# Patient Record
Sex: Female | Born: 2005 | Hispanic: Yes | Marital: Single | Smoking: Never smoker
Health system: Southern US, Community
[De-identification: ages and names within clinical notes are randomized; demographics above are authoritative.]

---
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
## 2023-03-10 IMAGING — CR DG ANKLE COMPLETE 3+V*R*
3 series · 3 of 3 positions shown · non-contrast
Comparison: 08/14/2016

CLINICAL DATA: Ankle trauma

EXAM:
RIGHT ANKLE - COMPLETE 3+ VIEW

[ankle ap]
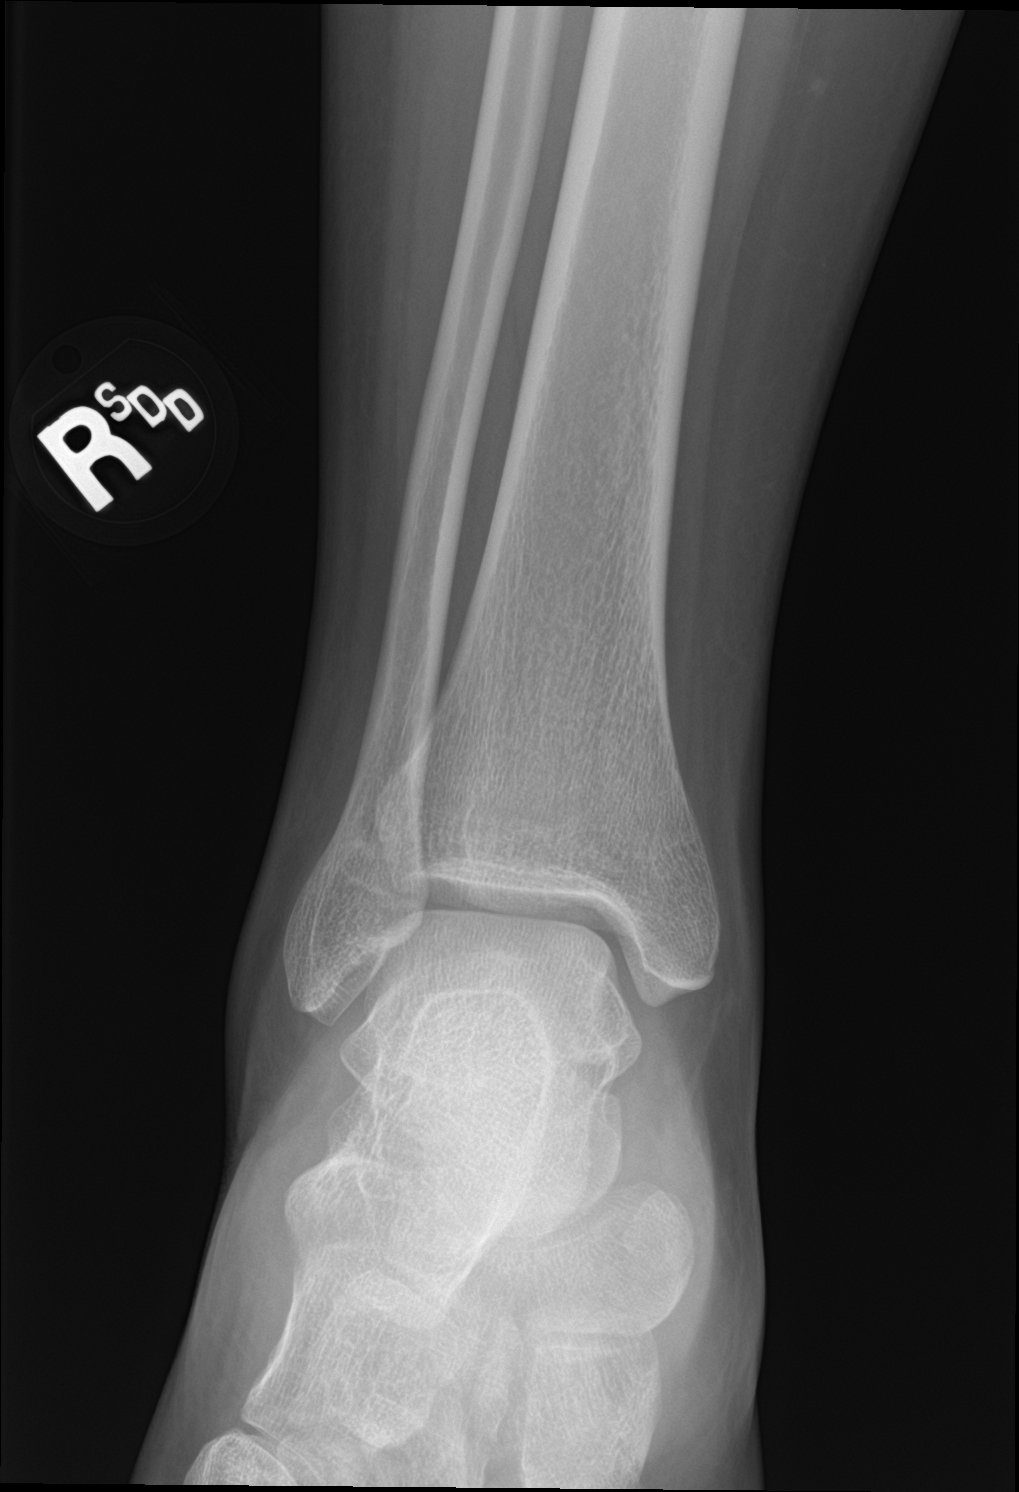

[ankle obl]
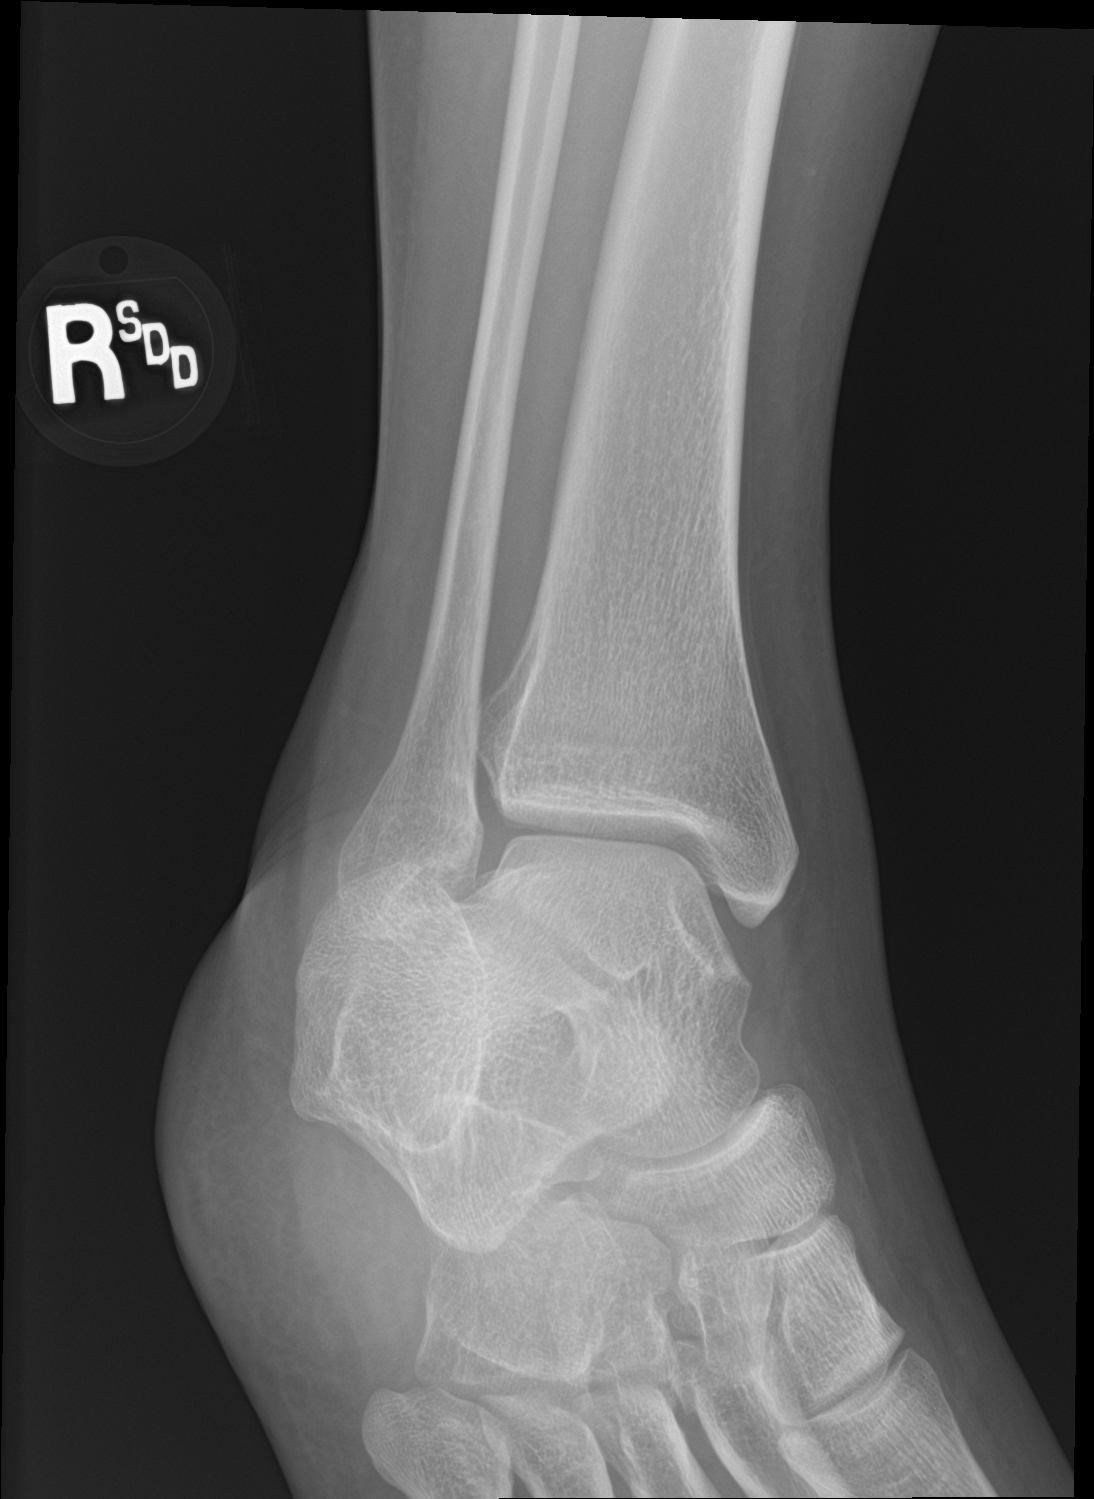

[ankle lat]
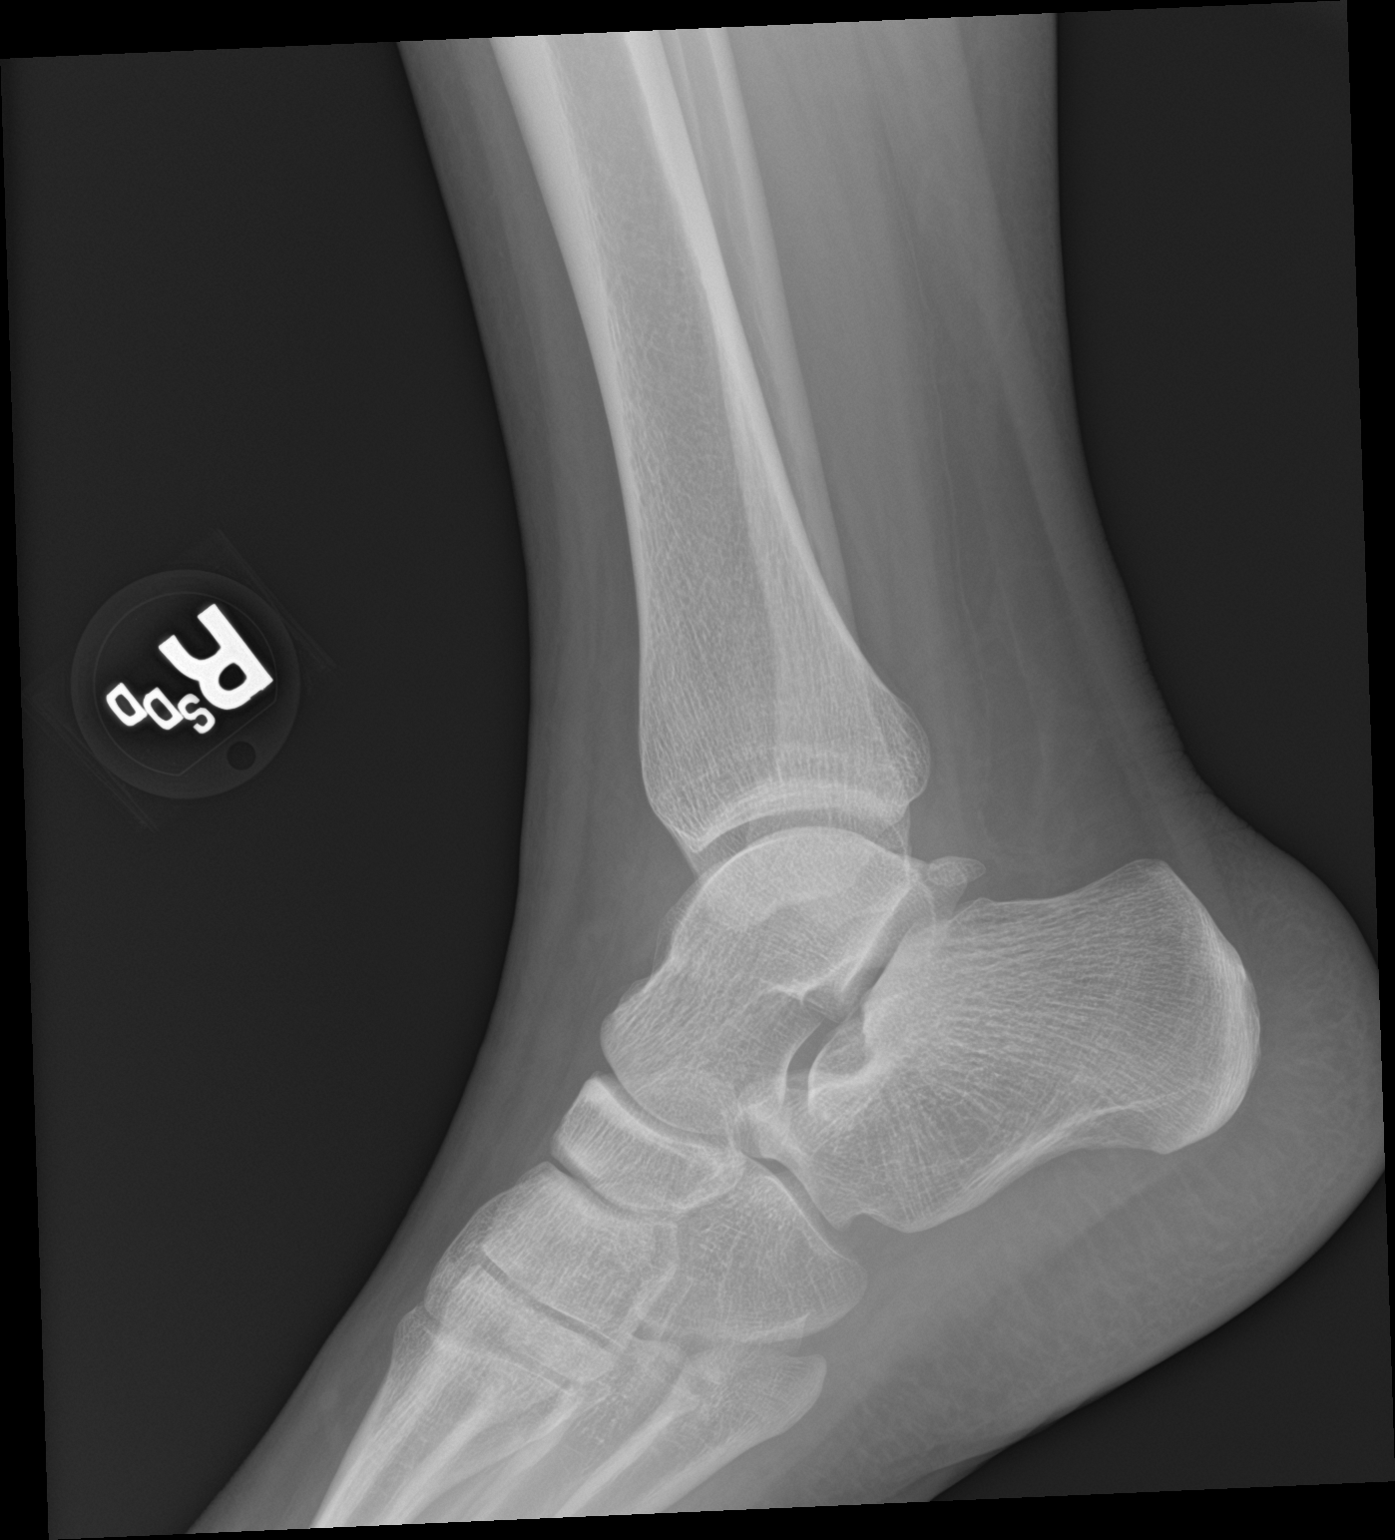

[3 of 3 positions shown; findings below may reference images not displayed]

FINDINGS: There is no evidence of fracture, dislocation, or joint effusion.
There is no evidence of arthropathy or other focal bone abnormality.
Soft tissues are unremarkable.
IMPRESSION: Negative.

## 2023-03-22 ENCOUNTER — Other Ambulatory Visit: Payer: Self-pay

## 2023-03-22 ENCOUNTER — Emergency Department

## 2023-03-22 DIAGNOSIS — J101 Influenza due to other identified influenza virus with other respiratory manifestations: Secondary | ICD-10-CM | POA: Insufficient documentation

## 2023-03-22 DIAGNOSIS — R059 Cough, unspecified: Secondary | ICD-10-CM | POA: Diagnosis present

## 2023-03-22 LAB — RESP PANEL BY RT-PCR (RSV, FLU A&B, COVID)  RVPGX2
Influenza A by PCR: POSITIVE — AB
Influenza B by PCR: NEGATIVE
Resp Syncytial Virus by PCR: NEGATIVE
SARS Coronavirus 2 by RT PCR: NEGATIVE

## 2023-03-22 NOTE — ED Triage Notes (Signed)
 Pt reports cough congestion fever x1 week.

## 2023-03-22 NOTE — ED Provider Triage Note (Signed)
 Emergency Medicine Provider Triage Evaluation Note  Diana Rivas , a 18 y.o. female  was evaluated in triage.  Pt complains of cough, congestion for a week.  Review of Systems  Positive: Cough and congestion Negative: Shob, cp  Physical Exam  BP 127/73   Pulse 96   Temp 98.4 F (36.9 C) (Oral)   Resp 18   Ht 5\' 4"  (1.626 m)   Wt 78.5 kg   SpO2 95%   BMI 29.70 kg/m  Gen:   Awake, no distress   Resp:  Normal effort  MSK:   Moves extremities without difficulty  Other:    Medical Decision Making  Medically screening exam initiated at 8:38 PM.  Appropriate orders placed.  Diana Rivas was informed that the remainder of the evaluation will be completed by another provider, this initial triage assessment does not replace that evaluation, and the importance of remaining in the ED until their evaluation is complete.  Flu/covid/rsv test and chest xray   Racheal Patches, PA-C 03/22/23 2038

## 2023-03-23 ENCOUNTER — Emergency Department
Admission: EM | Admit: 2023-03-23 | Discharge: 2023-03-23 | Disposition: A | Discharge status: Home / Self Care | Attending: Emergency Medicine | Admitting: Emergency Medicine

## 2023-03-23 DIAGNOSIS — J101 Influenza due to other identified influenza virus with other respiratory manifestations: Secondary | ICD-10-CM

## 2023-03-23 MED ORDER — BENZONATATE 100 MG PO CAPS
100.0000 mg | ORAL_CAPSULE | Freq: Once | ORAL | Status: AC
  Administered 2023-03-23: 100 mg via ORAL
  Filled 2023-03-23: qty 1

## 2023-03-23 MED ORDER — BENZONATATE 100 MG PO CAPS: 100.0000 mg | ORAL_CAPSULE | Freq: Three times a day (TID) | ORAL | 0 refills | Status: DC | PRN

## 2023-03-23 MED ORDER — ONDANSETRON HCL 4 MG PO TABS: 4.0000 mg | ORAL_TABLET | Freq: Three times a day (TID) | ORAL | 0 refills | Status: DC | PRN

## 2023-03-23 NOTE — Discharge Instructions (Signed)
 Please pick up the medication from the pharmacy and take as prescribed.  You can continue taking ibuprofen and Tylenol to help with fever symptoms.

## 2023-03-23 NOTE — ED Provider Notes (Signed)
 [facility] Provider Note    Event Date/Time   First MD Initiated Contact with Patient 03/23/23 0139     (approximate)   History   Cough   HPI Diana Rivas is a 18 y.o. female presenting today for cough and congestion.  Patient states she has had symptoms for the past week.  She has had dry cough, body aches, intermittent nausea.  Denies any difficulty breathing, vomiting, diarrhea, dysuria.  No obvious sick contacts.     Physical Exam   Triage Vital Signs: ED Triage Vitals  Encounter Vitals Group     BP 03/22/23 2038 127/73     Systolic BP Percentile --      Diastolic BP Percentile --      Pulse Rate 03/22/23 2038 96     Resp 03/22/23 2038 18     Temp 03/22/23 2038 98.4 F (36.9 C)     Temp Source 03/22/23 2038 Oral     SpO2 03/22/23 2038 95 %     Weight 03/22/23 2036 173 lb (78.5 kg)     Height 03/22/23 2036 5\' 4"  (1.626 m)     Head Circumference --      Peak Flow --      Pain Score 03/22/23 2036 7     Pain Loc --      Pain Education --      Exclude from Growth Chart --     Most recent vital signs: Vitals:   03/22/23 2038  BP: 127/73  Pulse: 96  Resp: 18  Temp: 98.4 F (36.9 C)  SpO2: 95%   Physical Exam: I have reviewed the vital signs and nursing notes. General: Awake, alert, no acute distress.  Nontoxic appearing. Head:  Atraumatic, normocephalic.   ENT:  EOM intact, PERRL. Oral mucosa is pink and moist with no lesions. Neck: Neck is supple with full range of motion, No meningeal signs. Cardiovascular:  RRR, No murmurs. Peripheral pulses palpable and equal bilaterally. Respiratory:  Symmetrical chest wall expansion.  No rhonchi, rales, or wheezes.  Good air movement throughout.  No use of accessory muscles.   Musculoskeletal:  No cyanosis or edema. Moving extremities with full ROM Abdomen:  Soft, nontender, nondistended. Neuro:  GCS 15, moving all four extremities, interacting appropriately. Speech clear. Psych:   Calm, appropriate.   Skin:  Warm, dry, no rash.    ED Results / Procedures / Treatments   Labs (all labs ordered are listed, but only abnormal results are displayed) Labs Reviewed  RESP PANEL BY RT-PCR (RSV, FLU A&B, COVID)  RVPGX2 - Abnormal; Notable for the following components:      Result Value   Influenza A by PCR POSITIVE (*)    All other components within normal limits     EKG    RADIOLOGY Independently interpreted with no acute pathology   PROCEDURES:  Critical Care performed: No  Procedures   MEDICATIONS ORDERED IN ED: Medications - No data to display   IMPRESSION / MDM / ASSESSMENT AND PLAN / ED COURSE  I reviewed the triage vital signs and the nursing notes.                              Differential diagnosis includes, but is not limited to, COVID, flu, RSV, pneumonia  Patient's presentation is most consistent with acute complicated illness / injury requiring diagnostic workup.  Patient is a 18 year old female presenting today for  cough, congestion, fever, and bodyaches for approximately 1 week.  Physical exam unremarkable and vital signs stable.  No respiratory distress.  Chest x-ray shows no evidence of pneumonia.  Patient tested positive for influenza A which I suspect is the source of her symptoms.  Will discharge her with Tessalon Perles and Zofran for symptomatic management.  Given strict return precautions for any difficulty worsening breathing.     FINAL CLINICAL IMPRESSION(S) / ED DIAGNOSES   Final diagnoses:  Influenza A     Rx / DC Orders   ED Discharge Orders          Ordered    ondansetron (ZOFRAN) 4 MG tablet  Every 8 hours PRN        03/23/23 0151    benzonatate (TESSALON PERLES) 100 MG capsule  3 times daily PRN        03/23/23 0151             Note:  This document was prepared using Dragon voice recognition software and may include unintentional dictation errors.   Janith Lima, MD 03/23/23 [phone]

## 2024-01-08 ENCOUNTER — Emergency Department
Admission: EM | Admit: 2024-01-08 | Discharge: 2024-01-08 | Disposition: A | Discharge status: Home / Self Care | Attending: Emergency Medicine | Admitting: Emergency Medicine

## 2024-01-08 ENCOUNTER — Other Ambulatory Visit: Payer: Self-pay

## 2024-01-08 DIAGNOSIS — N939 Abnormal uterine and vaginal bleeding, unspecified: Secondary | ICD-10-CM | POA: Diagnosis present

## 2024-01-08 DIAGNOSIS — N938 Other specified abnormal uterine and vaginal bleeding: Secondary | ICD-10-CM | POA: Diagnosis not present

## 2024-01-08 LAB — CBC WITH DIFFERENTIAL/PLATELET
Abs Immature Granulocytes: 0.02 K/uL (ref 0.00–0.07)
Basophils Absolute: 0 K/uL (ref 0.0–0.1)
Basophils Relative: 0 %
Eosinophils Absolute: 0.2 K/uL (ref 0.0–0.5)
Eosinophils Relative: 2 %
HCT: 41.6 % (ref 36.0–46.0)
Hemoglobin: 13.2 g/dL (ref 12.0–15.0)
Immature Granulocytes: 0 %
Lymphocytes Relative: 31 %
Lymphs Abs: 2.9 K/uL (ref 0.7–4.0)
MCH: 29.1 pg (ref 26.0–34.0)
MCHC: 31.7 g/dL (ref 30.0–36.0)
MCV: 91.6 fL (ref 80.0–100.0)
Monocytes Absolute: 0.7 K/uL (ref 0.1–1.0)
Monocytes Relative: 7 %
Neutro Abs: 5.5 K/uL (ref 1.7–7.7)
Neutrophils Relative %: 60 %
Platelets: 339 K/uL (ref 150–400)
RBC: 4.54 MIL/uL (ref 3.87–5.11)
RDW: 12.4 % (ref 11.5–15.5)
WBC: 9.3 K/uL (ref 4.0–10.5)
nRBC: 0 % (ref 0.0–0.2)

## 2024-01-08 LAB — URINALYSIS, ROUTINE W REFLEX MICROSCOPIC
Bacteria, UA: NONE SEEN
Bilirubin Urine: NEGATIVE
Glucose, UA: NEGATIVE mg/dL
Ketones, ur: NEGATIVE mg/dL
Nitrite: NEGATIVE
Protein, ur: NEGATIVE mg/dL
RBC / HPF: >50 RBC/hpf (ref 0–5)
Specific Gravity, Urine: 1.033 — ABNORMAL HIGH (ref 1.005–1.030)
pH: 6 (ref 5.0–8.0)

## 2024-01-08 LAB — BASIC METABOLIC PANEL WITH GFR
Anion gap: 10 (ref 5–15)
BUN: 13 mg/dL (ref 6–20)
CO2: 23 mmol/L (ref 22–32)
Calcium: 9 mg/dL (ref 8.9–10.3)
Chloride: 105 mmol/L (ref 98–111)
Creatinine, Ser: 0.66 mg/dL (ref 0.44–1.00)
GFR, Estimated: >60 mL/min
Glucose, Bld: 101 mg/dL — ABNORMAL HIGH (ref 70–99)
Potassium: 3.9 mmol/L (ref 3.5–5.1)
Sodium: 139 mmol/L (ref 135–145)

## 2024-01-08 LAB — POC URINE PREG, ED: Preg Test, Ur: NEGATIVE

## 2024-01-08 NOTE — Discharge Instructions (Signed)
 Call Monday to make an appointment.

## 2024-01-08 NOTE — ED Provider Notes (Signed)
 "  [facility] Provider Note    Event Date/Time   First MD Initiated Contact with Patient 01/08/24 [phone]     (approximate)   History   Vaginal Bleeding   HPI  Diana Rivas is a 18 y.o. female   presents to the ED with of vaginal bleeding for 1 month.  Patient states that she had normal periods until last month when she began bleeding.  She only sees blood when she is wiping and not enough to warrant wearing a pad.  This morning she has had some abdominal cramps along with bleeding and some clots.  Patient denies possibility of being pregnant as she is not sexually active.      Physical Exam   Triage Vital Signs: ED Triage Vitals  Encounter Vitals Group     BP 01/08/24 0826 (!) 136/93     Girls Systolic BP Percentile --      Girls Diastolic BP Percentile --      Boys Systolic BP Percentile --      Boys Diastolic BP Percentile --      Pulse Rate 01/08/24 0826 80     Resp 01/08/24 0826 18     Temp 01/08/24 0826 98 F (36.7 C)     Temp Source 01/08/24 0826 Oral     SpO2 01/08/24 0826 98 %     Weight --      Height --      Head Circumference --      Peak Flow --      Pain Score 01/08/24 0827 6     Pain Loc --      Pain Education --      Exclude from Growth Chart --     Most recent vital signs: Vitals:   01/08/24 0826  BP: (!) 136/93  Pulse: 80  Resp: 18  Temp: 98 F (36.7 C)  SpO2: 98%     General: Awake, no distress.  Alert, talkative, ambulatory without any assistance. CV:  Good peripheral perfusion.  Heart rate and rate and rhythm. Resp:  Normal effort.  Lungs clear bilaterally. Abd:  No distention.  Other:     ED Results / Procedures / Treatments   Labs (all labs ordered are listed, but only abnormal results are displayed) Labs Reviewed  URINALYSIS, ROUTINE W REFLEX MICROSCOPIC - Abnormal; Notable for the following components:      Result Value   Color, Urine YELLOW (*)    APPearance HAZY (*)    Specific Gravity,  Urine 1.033 (*)    Hgb urine dipstick MODERATE (*)    Leukocytes,Ua MODERATE (*)    All other components within normal limits  BASIC METABOLIC PANEL WITH GFR - Abnormal; Notable for the following components:   Glucose, Bld 101 (*)    All other components within normal limits  CBC WITH DIFFERENTIAL/PLATELET  HCG, QUANTITATIVE, PREGNANCY  POC URINE PREG, ED      PROCEDURES:  Critical Care performed:   Procedures   MEDICATIONS ORDERED IN ED: Medications - No data to display   IMPRESSION / MDM / ASSESSMENT AND PLAN / ED COURSE  I reviewed the triage vital signs and the nursing notes.   Differential diagnosis includes, but is not limited to, abnormal uterine bleeding, pregnancy, urinary tract infection.  18 year old female presents to the ED with complaint of dysfunctional uterine bleeding for the past month which is not enough for the patient to wear a pad and states that she only  sees it when she urinates.  Basic lab work was reassuring and patient was made aware.  Full exam was limited secondary to patient got a call to take care of the emergency at work.  I explained that her lab work was she indicated that she had not lost a lot of blood as her hemoglobin was 13.2.  She is referred to an gynecology for evaluation of her abnormal bleeding.  She is strongly encouraged to call Monday and make an appointment.    Patient's presentation is most consistent with acute complicated illness / injury requiring diagnostic workup.  FINAL CLINICAL IMPRESSION(S) / ED DIAGNOSES   Final diagnoses:  Dysfunctional uterine bleeding     Rx / DC Orders   ED Discharge Orders     None        Note:  This document was prepared using Dragon voice recognition software and may include unintentional dictation errors.   Saunders Shona CROME, PA-C 01/08/24 1356    Arlander Charleston, MD 01/08/24 [phone]  "

## 2024-01-08 NOTE — ED Notes (Addendum)
 Pt telling this RN she needs to leave, has an emergency she needs to take care of. PA Rhonda made aware, PA at bedside.

## 2024-01-08 NOTE — ED Triage Notes (Signed)
 Pt to ED via POV from home. Pt reports normal periods until the last month she has been bleeding x1 month. Blood noticed only when wiping. Pt reports this morning 10/10 abd cramps and constant bleeding with clots.
